# Patient Record
Sex: Female | Born: 1985 | Race: White | Hispanic: No | Marital: Single | Smoking: Current every day smoker
Health system: Southern US, Community
[De-identification: ages and names within clinical notes are randomized; demographics above are authoritative.]

---

## 2014-11-30 ENCOUNTER — Emergency Department (HOSPITAL_COMMUNITY): Payer: BLUE CROSS/BLUE SHIELD

## 2014-11-30 ENCOUNTER — Emergency Department (HOSPITAL_COMMUNITY)
Admission: EM | Admit: 2014-11-30 | Discharge: 2014-11-30 | Disposition: A | Discharge status: Home / Self Care | Payer: BLUE CROSS/BLUE SHIELD | Attending: Physician Assistant | Admitting: Physician Assistant

## 2014-11-30 ENCOUNTER — Encounter (HOSPITAL_COMMUNITY): Payer: Self-pay | Admitting: *Deleted

## 2014-11-30 DIAGNOSIS — R1031 Right lower quadrant pain: Secondary | ICD-10-CM

## 2014-11-30 DIAGNOSIS — R197 Diarrhea, unspecified: Secondary | ICD-10-CM | POA: Diagnosis not present

## 2014-11-30 DIAGNOSIS — Z3202 Encounter for pregnancy test, result negative: Secondary | ICD-10-CM | POA: Insufficient documentation

## 2014-11-30 DIAGNOSIS — Z720 Tobacco use: Secondary | ICD-10-CM | POA: Insufficient documentation

## 2014-11-30 DIAGNOSIS — R110 Nausea: Secondary | ICD-10-CM | POA: Diagnosis not present

## 2014-11-30 DIAGNOSIS — R5383 Other fatigue: Secondary | ICD-10-CM | POA: Insufficient documentation

## 2014-11-30 LAB — CBC WITH DIFFERENTIAL/PLATELET
BASOS ABS: 0 10*3/uL (ref 0.0–0.1)
BASOS PCT: 0 % (ref 0–1)
Eosinophils Absolute: 0 10*3/uL (ref 0.0–0.7)
Eosinophils Relative: 0 % (ref 0–5)
HEMATOCRIT: 38.8 % (ref 36.0–46.0)
HEMOGLOBIN: 12.9 g/dL (ref 12.0–15.0)
LYMPHS PCT: 9 % — AB (ref 12–46)
Lymphs Abs: 0.8 10*3/uL (ref 0.7–4.0)
MCH: 31.3 pg (ref 26.0–34.0)
MCHC: 33.2 g/dL (ref 30.0–36.0)
MCV: 94.2 fL (ref 78.0–100.0)
Monocytes Absolute: 0.7 10*3/uL (ref 0.1–1.0)
Monocytes Relative: 7 % (ref 3–12)
NEUTROS ABS: 8.2 10*3/uL — AB (ref 1.7–7.7)
NEUTROS PCT: 84 % — AB (ref 43–77)
Platelets: 234 10*3/uL (ref 150–400)
RBC: 4.12 MIL/uL (ref 3.87–5.11)
RDW: 12.5 % (ref 11.5–15.5)
WBC: 9.8 10*3/uL (ref 4.0–10.5)

## 2014-11-30 LAB — COMPREHENSIVE METABOLIC PANEL
ALBUMIN: 4.5 g/dL (ref 3.5–5.0)
ALK PHOS: 64 U/L (ref 38–126)
ALT: 13 U/L — ABNORMAL LOW (ref 14–54)
ANION GAP: 10 (ref 5–15)
AST: 27 U/L (ref 15–41)
BILIRUBIN TOTAL: 0.6 mg/dL (ref 0.3–1.2)
BUN: 11 mg/dL (ref 6–20)
CALCIUM: 9.5 mg/dL (ref 8.9–10.3)
CO2: 24 mmol/L (ref 22–32)
Chloride: 100 mmol/L — ABNORMAL LOW (ref 101–111)
Creatinine, Ser: 0.73 mg/dL (ref 0.44–1.00)
GLUCOSE: 104 mg/dL — AB (ref 65–99)
POTASSIUM: 3.5 mmol/L (ref 3.5–5.1)
Sodium: 134 mmol/L — ABNORMAL LOW (ref 135–145)
TOTAL PROTEIN: 8.1 g/dL (ref 6.5–8.1)

## 2014-11-30 LAB — I-STAT BETA HCG BLOOD, ED (MC, WL, AP ONLY)

## 2014-11-30 LAB — PREGNANCY, URINE: Preg Test, Ur: NEGATIVE

## 2014-11-30 LAB — LIPASE, BLOOD: Lipase: 14 U/L — ABNORMAL LOW (ref 22–51)

## 2014-11-30 MED ORDER — SODIUM CHLORIDE 0.9 % IV BOLUS (SEPSIS)
1000.0000 mL | Freq: Once | INTRAVENOUS | Status: AC
  Administered 2014-11-30: 1000 mL via INTRAVENOUS

## 2014-11-30 MED ORDER — IOHEXOL 300 MG/ML  SOLN
50.0000 mL | Freq: Once | INTRAMUSCULAR | Status: AC | PRN
  Administered 2014-11-30: 50 mL via ORAL

## 2014-11-30 MED ORDER — MORPHINE SULFATE (PF) 4 MG/ML IV SOLN
4.0000 mg | Freq: Once | INTRAVENOUS | Status: AC
  Administered 2014-11-30: 4 mg via INTRAVENOUS
  Filled 2014-11-30: qty 1

## 2014-11-30 MED ORDER — ONDANSETRON HCL 4 MG/2ML IJ SOLN
4.0000 mg | Freq: Once | INTRAMUSCULAR | Status: AC
  Administered 2014-11-30: 4 mg via INTRAVENOUS
  Filled 2014-11-30: qty 2

## 2014-11-30 MED ORDER — IOHEXOL 300 MG/ML  SOLN
100.0000 mL | Freq: Once | INTRAMUSCULAR | Status: AC | PRN
  Administered 2014-11-30: 100 mL via INTRAVENOUS

## 2014-11-30 NOTE — Discharge Instructions (Signed)
We are unsure what caused your pain today. Please follow-up with your doctors at your college in the morning. Please return immediately if you have any additional symptoms.  Abdominal Pain, Women Abdominal (stomach, pelvic, or belly) pain can be caused by many things. It is important to tell your doctor:  The location of the pain.  Does it come and go or is it present all the time?  Are there things that start the pain (eating certain foods, exercise)?  Are there other symptoms associated with the pain (fever, nausea, vomiting, diarrhea)? All of this is helpful to know when trying to find the cause of the pain. CAUSES   Stomach: virus or bacteria infection, or ulcer.  Intestine: appendicitis (inflamed appendix), regional ileitis (Crohn's disease), ulcerative colitis (inflamed colon), irritable bowel syndrome, diverticulitis (inflamed diverticulum of the colon), or cancer of the stomach or intestine.  Gallbladder disease or stones in the gallbladder.  Kidney disease, kidney stones, or infection.  Pancreas infection or cancer.  Fibromyalgia (pain disorder).  Diseases of the female organs:  Uterus: fibroid (non-cancerous) tumors or infection.  Fallopian tubes: infection or tubal pregnancy.  Ovary: cysts or tumors.  Pelvic adhesions (scar tissue).  Endometriosis (uterus lining tissue growing in the pelvis and on the pelvic organs).  Pelvic congestion syndrome (female organs filling up with blood just before the menstrual period).  Pain with the menstrual period.  Pain with ovulation (producing an egg).  Pain with an IUD (intrauterine device, birth control) in the uterus.  Cancer of the female organs.  Functional pain (pain not caused by a disease, may improve without treatment).  Psychological pain.  Depression. DIAGNOSIS  Your doctor will decide the seriousness of your pain by doing an examination.  Blood tests.  X-rays.  Ultrasound.  CT scan (computed  tomography, special type of X-ray).  MRI (magnetic resonance imaging).  Cultures, for infection.  Barium enema (dye inserted in the large intestine, to better view it with X-rays).  Colonoscopy (looking in intestine with a lighted tube).  Laparoscopy (minor surgery, looking in abdomen with a lighted tube).  Major abdominal exploratory surgery (looking in abdomen with a large incision). TREATMENT  The treatment will depend on the cause of the pain.   Many cases can be observed and treated at home.  Over-the-counter medicines recommended by your caregiver.  Prescription medicine.  Antibiotics, for infection.  Birth control pills, for painful periods or for ovulation pain.  Hormone treatment, for endometriosis.  Nerve blocking injections.  Physical therapy.  Antidepressants.  Counseling with a psychologist or psychiatrist.  Minor or major surgery. HOME CARE INSTRUCTIONS   Do not take laxatives, unless directed by your caregiver.  Take over-the-counter pain medicine only if ordered by your caregiver. Do not take aspirin because it can cause an upset stomach or bleeding.  Try a clear liquid diet (broth or water) as ordered by your caregiver. Slowly move to a bland diet, as tolerated, if the pain is related to the stomach or intestine.  Have a thermometer and take your temperature several times a day, and record it.  Bed rest and sleep, if it helps the pain.  Avoid sexual intercourse, if it causes pain.  Avoid stressful situations.  Keep your follow-up appointments and tests, as your caregiver orders.  If the pain does not go away with medicine or surgery, you may try:  Acupuncture.  Relaxation exercises (yoga, meditation).  Group therapy.  Counseling. SEEK MEDICAL CARE IF:   You notice certain foods cause  stomach pain.  Your home care treatment is not helping your pain.  You need stronger pain medicine.  You want your IUD removed.  You feel faint  or lightheaded.  You develop nausea and vomiting.  You develop a rash.  You are having side effects or an allergy to your medicine. SEEK IMMEDIATE MEDICAL CARE IF:   Your pain does not go away or gets worse.  You have a fever.  Your pain is felt only in portions of the abdomen. The right side could possibly be appendicitis. The left lower portion of the abdomen could be colitis or diverticulitis.  You are passing blood in your stools (bright red or black tarry stools, with or without vomiting).  You have blood in your urine.  You develop chills, with or without a fever.  You pass out. MAKE SURE YOU:   Understand these instructions.  Will watch your condition.  Will get help right away if you are not doing well or get worse. Document Released: 01/22/2007 Document Revised: 08/11/2013 Document Reviewed: 02/11/2009 [facility] Patient Information 2015 [place], [place]. This information is not intended to replace advice given to you by your health care provider. Make sure you discuss any questions you have with your health care provider.

## 2014-11-30 NOTE — ED Notes (Signed)
Pt reports abd pain since Saturday, pain has increased, [facility], paperwork states " fever>102 with RLQ tenderness. UA negative. CBC with WBC #10.5 with left shift".   Pain 8/10. Reports nausea, denies vomiting.

## 2014-11-30 NOTE — ED Provider Notes (Signed)
CSN: [id number]     Arrival date & time 11/30/14  1726 History   First MD Initiated Contact with Patient 11/30/14 1748     Chief Complaint  Patient presents with  . Abdominal Pain, r/o Appendicitis      (Consider location/radiation/quality/duration/timing/severity/associated sxs/prior Treatment) HPI   Patient is a 29 year old female presenting with right lower quadrant pain started this morning. She had diarrhea yesterday then today started with right lower quadrant pain and nausea. Patient seen [facility] and sent here for rule out appendicitis. Patient's had no vaginal discharge or other symptoms.  History reviewed. No pertinent past medical history. History reviewed. No pertinent past surgical history. History reviewed. No pertinent family history. Social History  Substance Use Topics  . Smoking status: Current Every Day Smoker -- 0.10 packs/day for 10 years    Types: Cigarettes  . Smokeless tobacco: None  . Alcohol Use: Yes   OB History    No data available     Review of Systems  Constitutional: Positive for fever and fatigue. Negative for activity change.  HENT: Negative for congestion and drooling.   Eyes: Negative for discharge.  Respiratory: Negative for cough and chest tightness.   Cardiovascular: Negative for chest pain.  Gastrointestinal: Positive for nausea, abdominal pain and diarrhea. Negative for vomiting and abdominal distention.  Genitourinary: Negative for dysuria and difficulty urinating.  Musculoskeletal: Negative for joint swelling.  Skin: Negative for rash.  Allergic/Immunologic: Negative for immunocompromised state.  Neurological: Negative for seizures and speech difficulty.  Psychiatric/Behavioral: Negative for behavioral problems and agitation.      Allergies  Review of patient's allergies indicates no known allergies.  Home Medications   Prior to Admission medications   Not on File   BP 116/66 mmHg  Pulse 116  Temp(Src) 101.6  F (38.7 C) (Oral)  Resp 16  SpO2 99%  LMP 11/18/2014 (Approximate) Physical Exam  Constitutional: She is oriented to person, place, and time. She appears well-developed and well-nourished.  HENT:  Head: Normocephalic and atraumatic.  Eyes: Conjunctivae are normal. Right eye exhibits no discharge.  Neck: Neck supple.  Cardiovascular: Normal rate, regular rhythm and normal heart sounds.   No murmur heard. Pulmonary/Chest: Effort normal and breath sounds normal. She has no wheezes. She has no rales.  Abdominal: Soft. She exhibits no distension. There is tenderness.  Right lower quadrant tenderness.  Musculoskeletal: Normal range of motion. She exhibits no edema.  Neurological: She is oriented to person, place, and time. No cranial nerve deficit.  Skin: Skin is warm and dry. No rash noted. She is not diaphoretic.  Psychiatric: She has a normal mood and affect. Her behavior is normal.  Nursing note and vitals reviewed.   ED Course  Procedures (including critical care time) Labs Review Labs Reviewed  LIPASE, BLOOD  COMPREHENSIVE METABOLIC PANEL  CBC WITH DIFFERENTIAL/PLATELET  PREGNANCY, URINE  I-STAT BETA HCG BLOOD, ED ([facility], [facility], [facility] ONLY)    Imaging Review No results found. I have personally reviewed and evaluated these images and lab results as part of my medical decision-making.   EKG Interpretation None      MDM   Final diagnoses:  None   patient is otherwise healthy 29 year old  female presenting with right lower quadrant pain and fever. Highly suspect appendicitis at this time. We'll get point-of-care pregnancy test and then CT scan of the abdomen. We'll give fluids morphine and Zofran to help with symptoms.  If CT negative we'll consider doing vaginal exam and then  [place].  CT negative, [place] negative.  Pain resolved. No CMT tenderness, pt is lesbian, no female partners, recent neg STD testing 6 mo ago.   Will have patietn follow up with student [facility] in the am. She is  taking PO.   Ezzard Ditmer Randall An, MD 11/30/14 2245

## 2014-11-30 NOTE — ED Notes (Signed)
Pt has sprite at the bedside.  Pt sts she is able to hold down fluids without problem.  RN notified.

## 2014-12-01 LAB — HIV ANTIBODY (ROUTINE TESTING W REFLEX): HIV Screen 4th Generation wRfx: NONREACTIVE

## 2014-12-01 LAB — RPR: RPR: NONREACTIVE

## 2016-11-04 IMAGING — US US PELVIS COMPLETE
1 series · 14 of 25 positions shown · non-contrast
Comparison: None

CLINICAL DATA: Right lower quadrant pain

EXAM:
TRANSABDOMINAL AND TRANSVAGINAL ULTRASOUND OF PELVIS
TECHNIQUE: Both transabdominal and transvaginal ultrasound examinations of the
pelvis were performed. Transabdominal technique was performed for
global imaging of the pelvis including uterus, ovaries, adnexal
regions, and pelvic cul-de-sac. It was necessary to proceed with
endovaginal exam following the transabdominal exam to visualize the
right ovary.

[Series 1: us pelvis complete · 0.20mm/px · 14 of 40 slices shown]
[im 1/40]
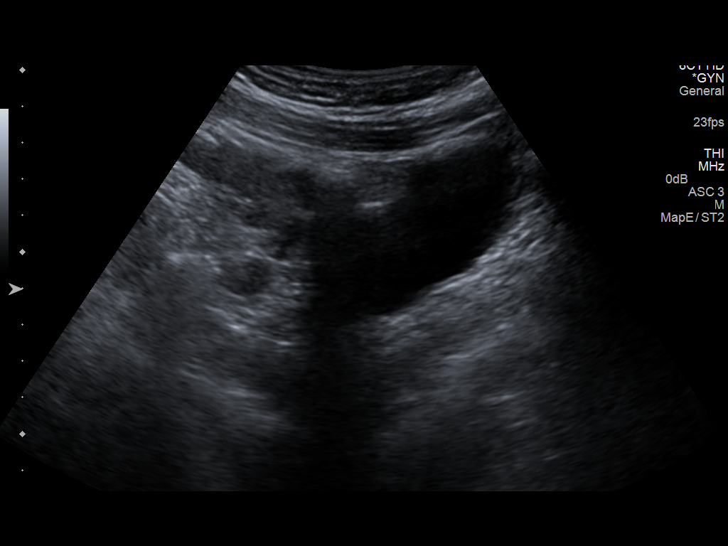
[im 4/40]
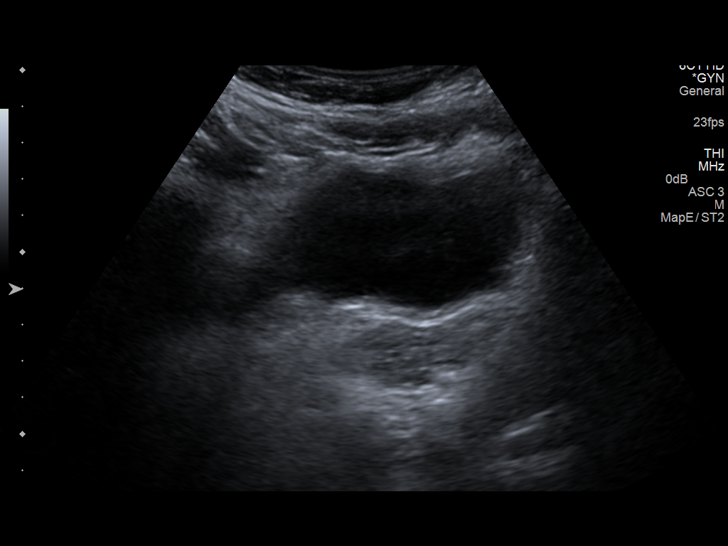
[im 7/40]
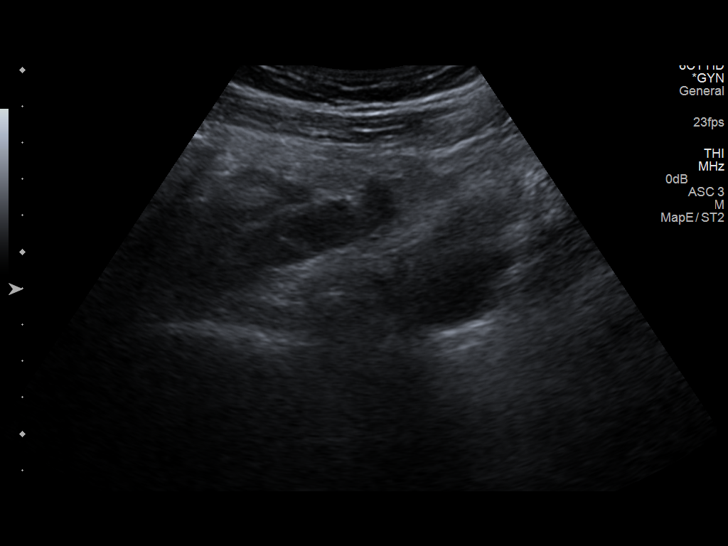
[im 10/40]
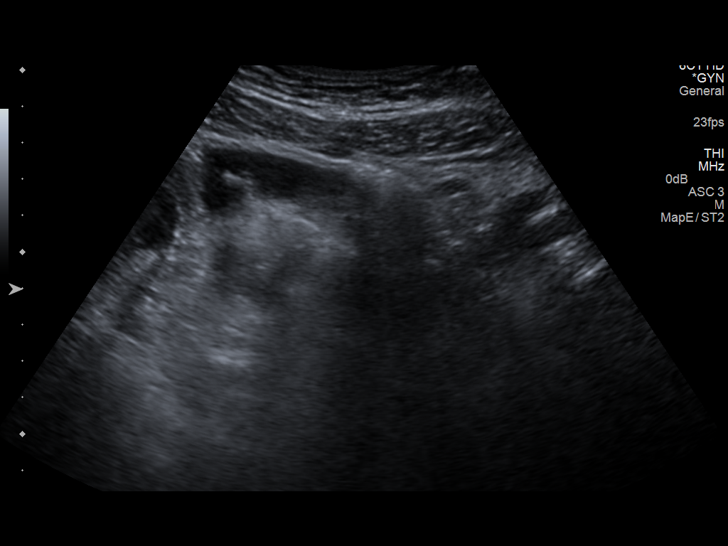
[im 14/40]
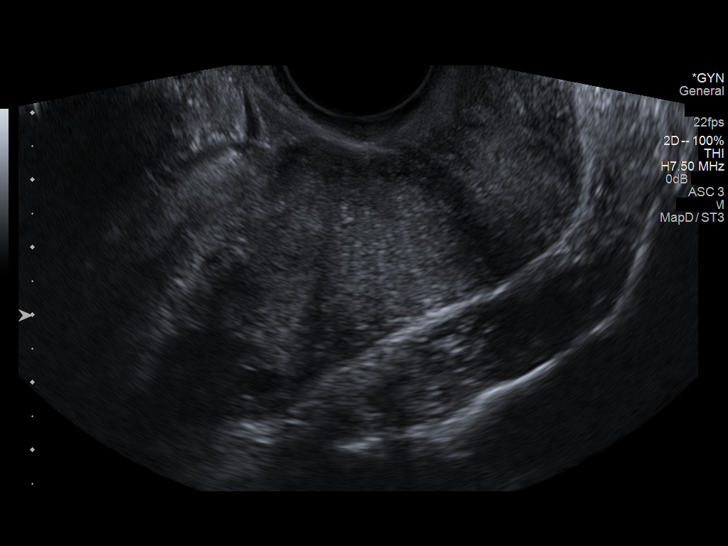
[im 15/40]
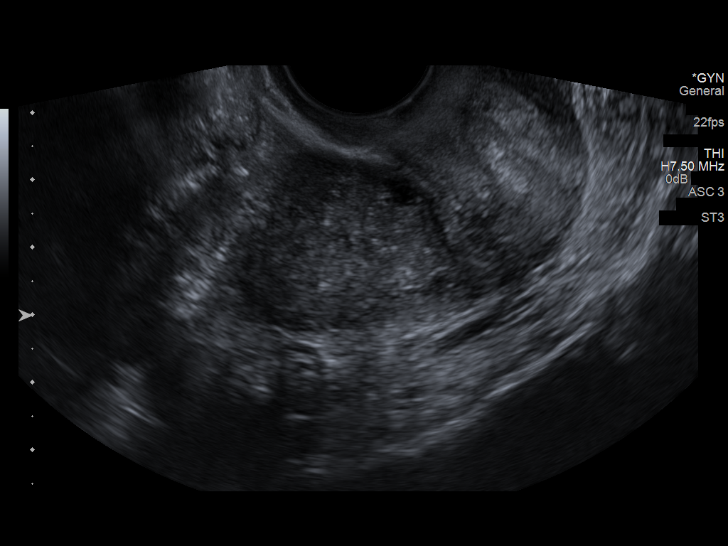
[im 18/40]
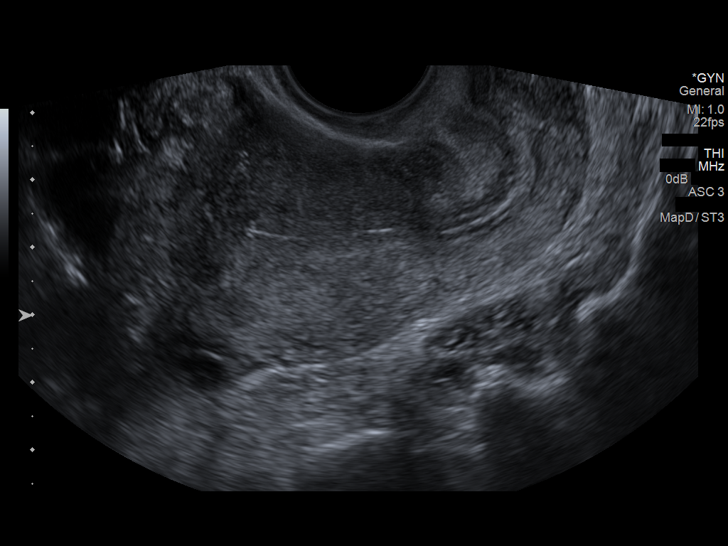
[im 22/40]
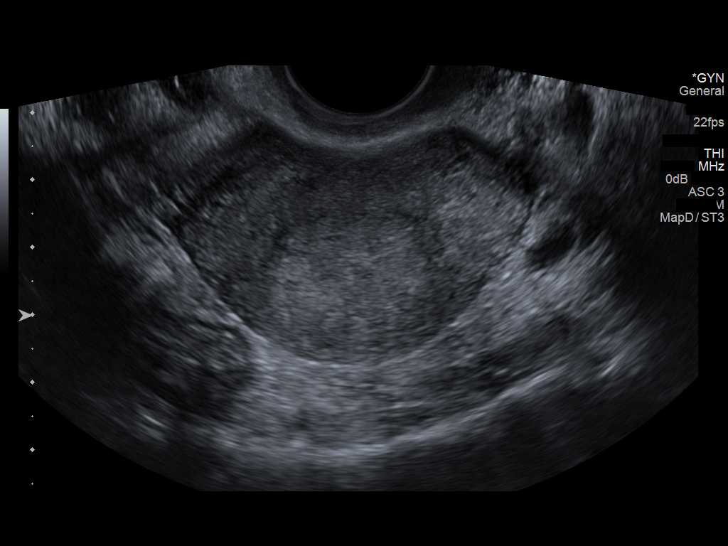
[im 25/40]
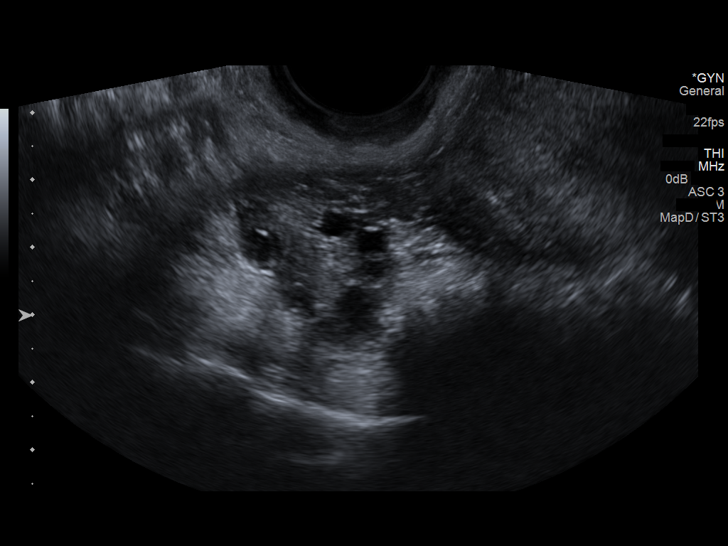
[im 27/40]
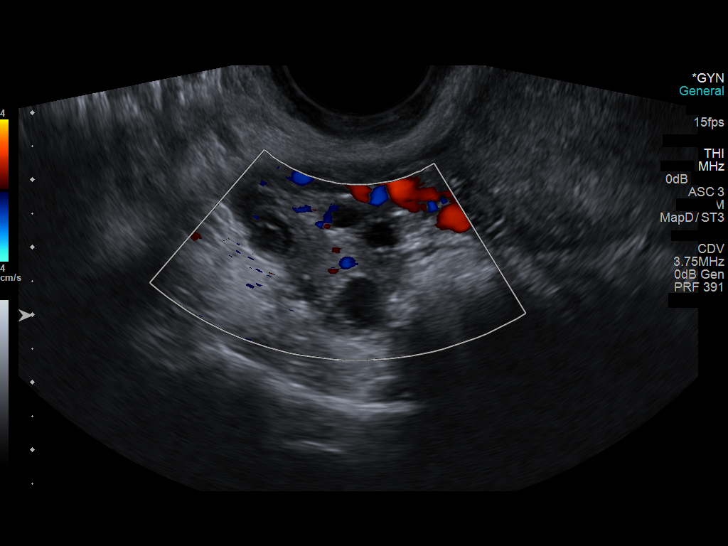
[im 30/40]
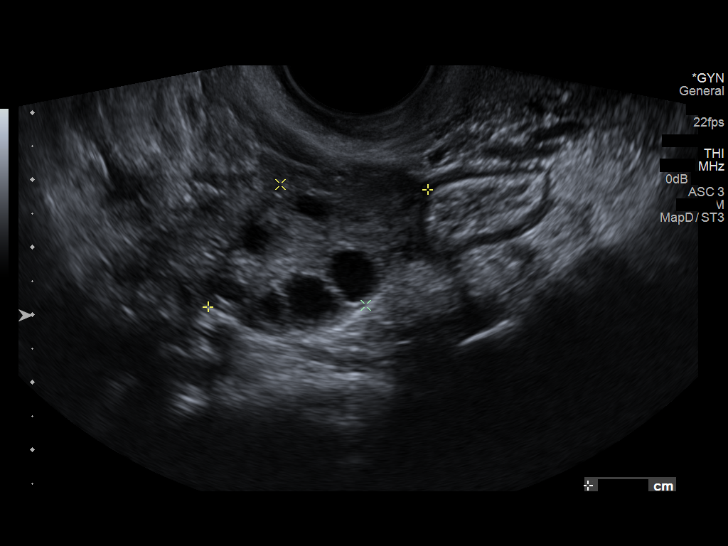
[im 33/40]
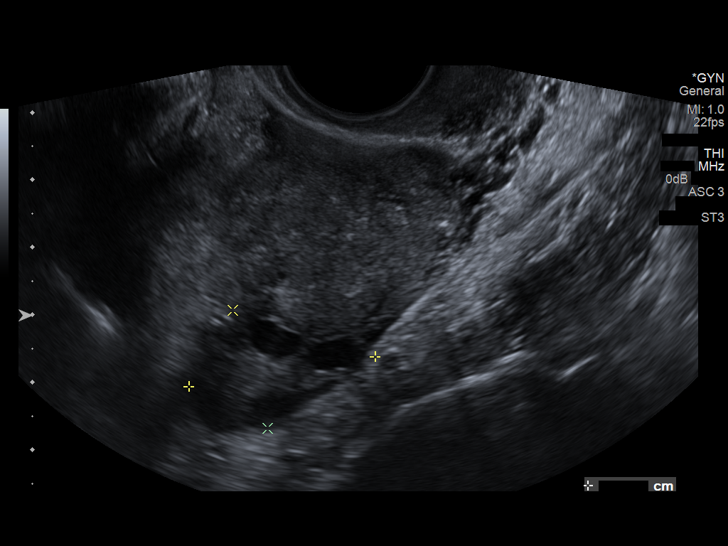
[im 36/40]
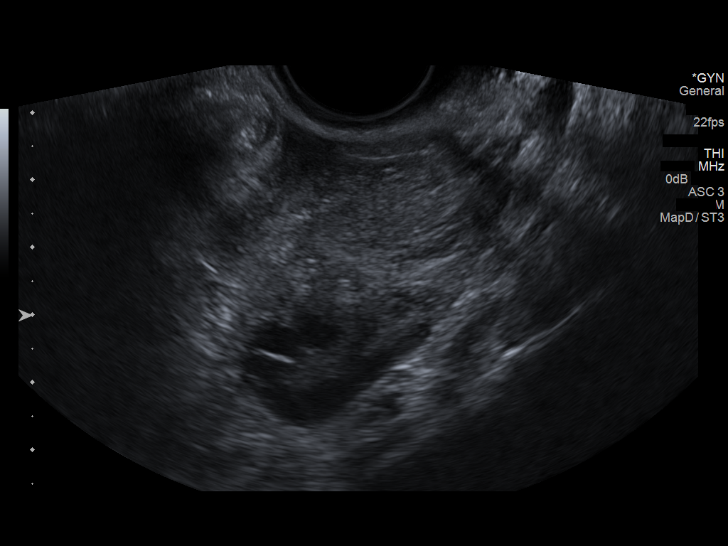
[im 40/40]
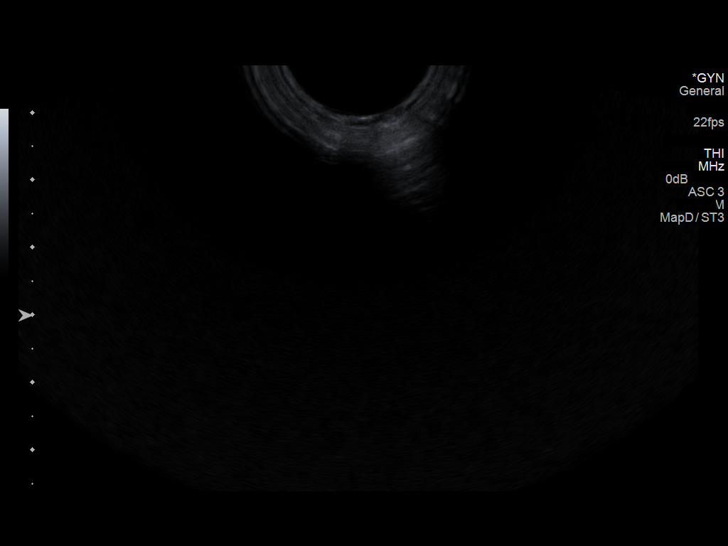

[14 of 25 positions shown; findings below may reference images not displayed]

FINDINGS: Uterus

Measurements: 5.5 x 3.7 x 5.1 cm. No fibroids or other mass
visualized.

Endometrium

Thickness: 7 mm..  No focal abnormality visualized.

Right ovary

Measurements: 3.7 x 2.2 x 2.0 cm.. Normal appearance/no adnexal
mass.

Left ovary

Measurements: 2.8 x 1.8 x 3.1 cm. Normal appearance/no adnexal mass.

Other findings

Trace free fluid noted.
IMPRESSION: 1. No acute findings.  No explanation for right lower quadrant pain.
2. Trace amount of free fluid noted within the pelvis.

## 2017-02-08 IMAGING — CT CT ABD-PELV W/ CM
2 of 4 series · 17 of 46 positions shown, 19 images · IV contrast (OMNIPAQUE 300)
Comparison: None.

CLINICAL DATA: Acute right lower quadrant abdominal pain.

EXAM:
CT ABDOMEN AND PELVIS WITH CONTRAST
TECHNIQUE: Multidetector CT imaging of the abdomen and pelvis was performed
using the standard protocol following bolus administration of
intravenous contrast.
CONTRAST:  100mL OMNIPAQUE IOHEXOL 300 MG/ML SOLN, 50mL OMNIPAQUE
IOHEXOL 300 MG/ML SOLN

[Series 2: abd/pel with · axial · 0.74mm/px · z∈[-655,-275]mm · 14 of 84 slices shown, 16 images]
[im 4/84  soft-tissue]
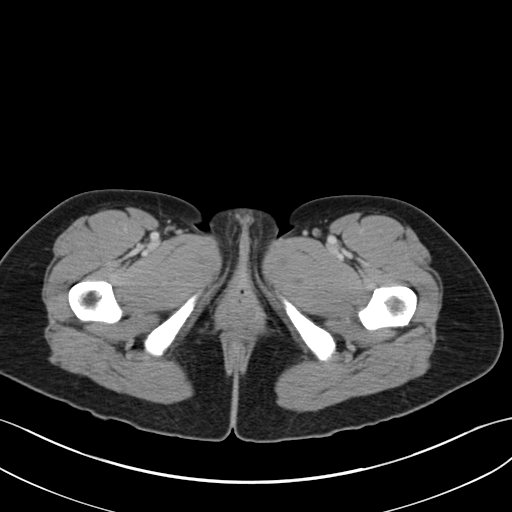
[im 4/84  bone]
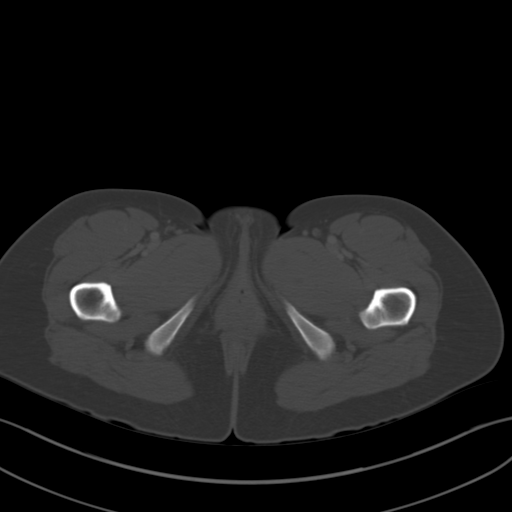
[im 10/84  soft-tissue]
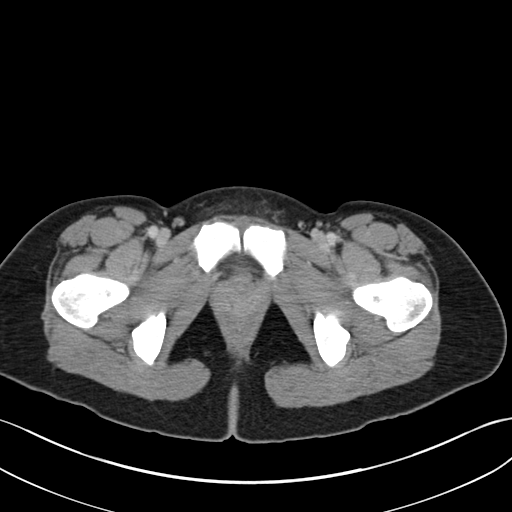
[im 17/84  soft-tissue]
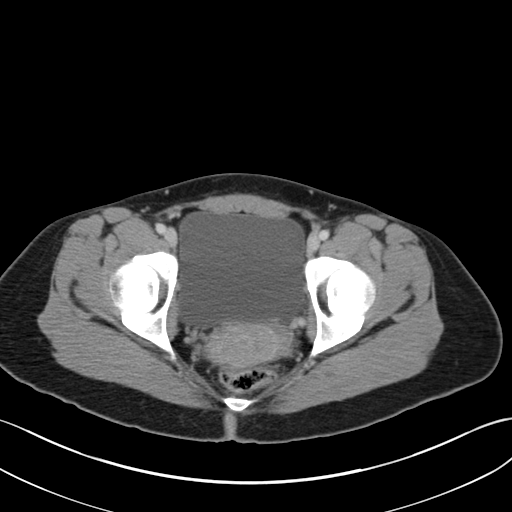
[im 24/84  soft-tissue]
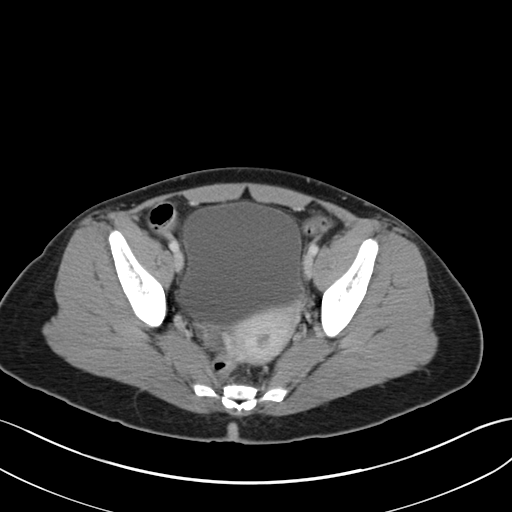
[im 27/84  soft-tissue]
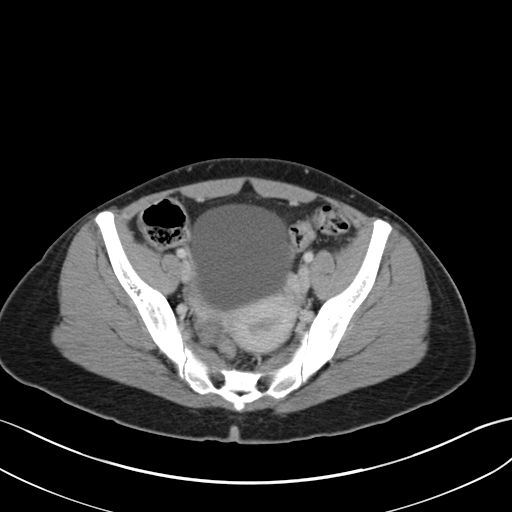
[im 34/84  soft-tissue]
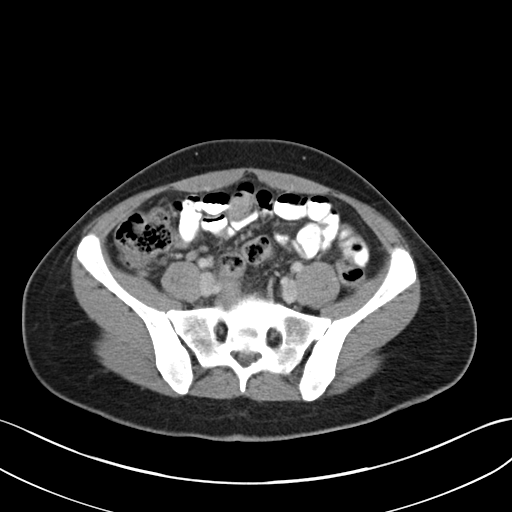
[im 40/84  soft-tissue]
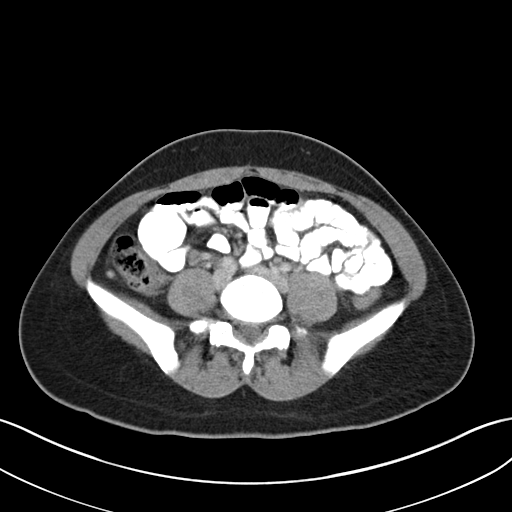
[im 44/84  soft-tissue]
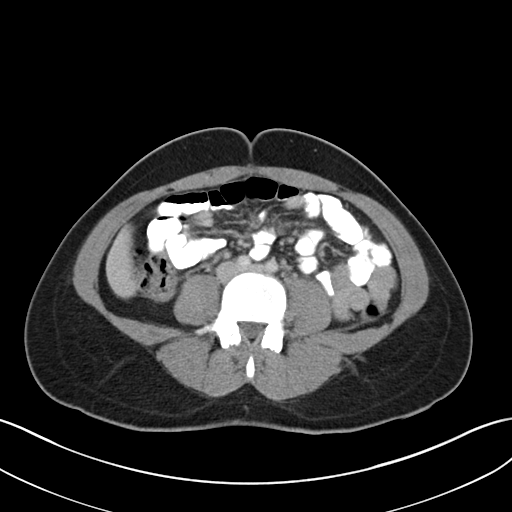
[im 50/84  soft-tissue]
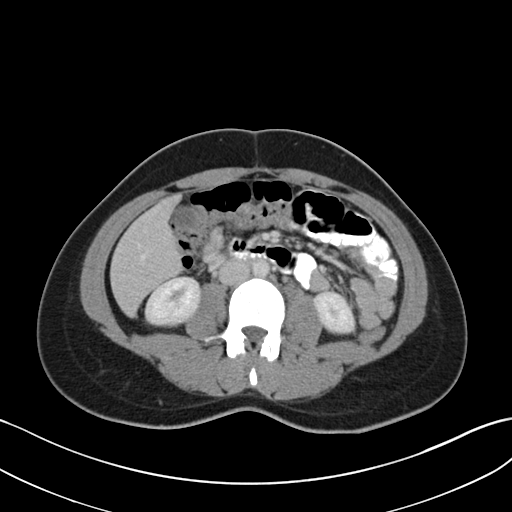
[im 50/84  bone]
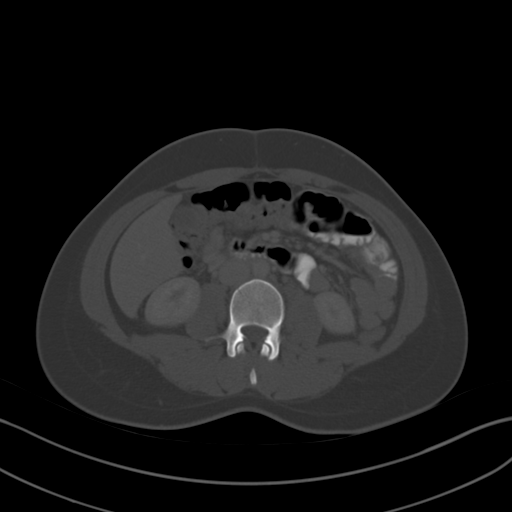
[im 57/84  soft-tissue]
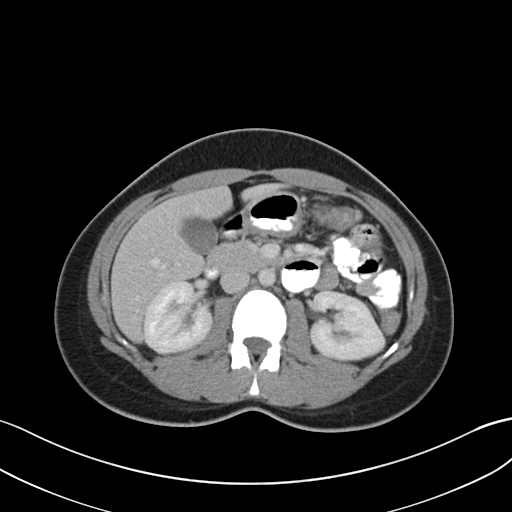
[im 64/84  soft-tissue]
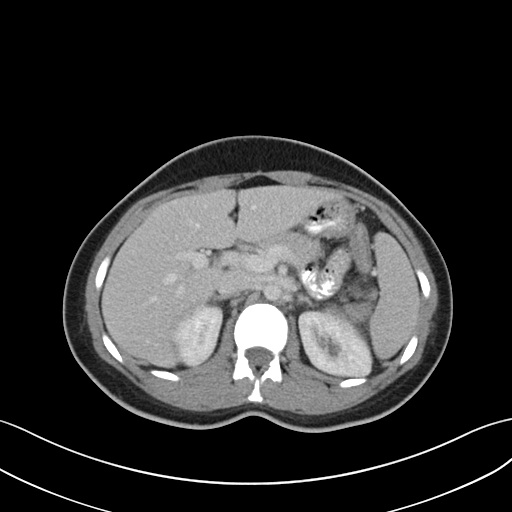
[im 67/84  soft-tissue]
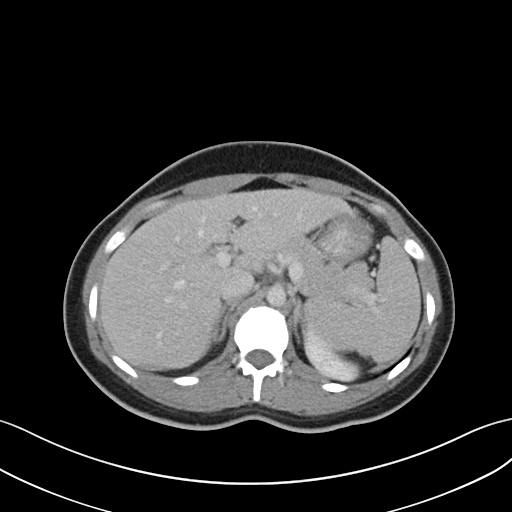
[im 74/84  soft-tissue]
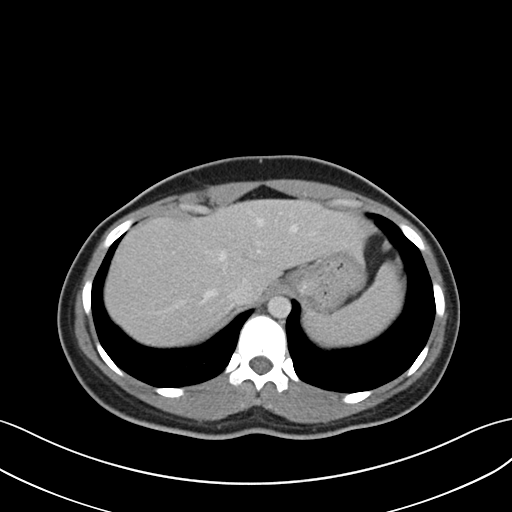
[im 80/84  soft-tissue]
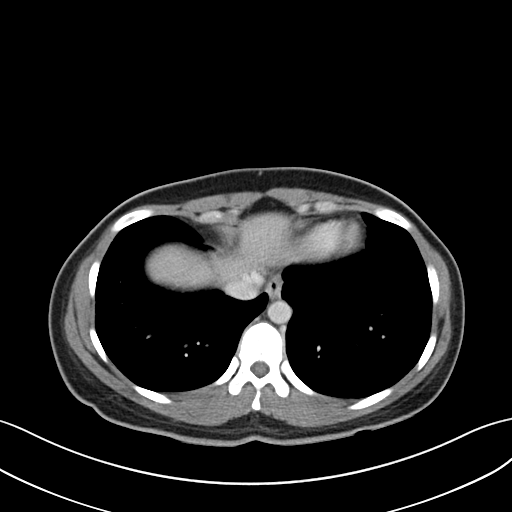

[Series 3: coronal a/|p · coronal · 0.76mm/px · 3 of 83 slices shown]
[im 28/83  soft-tissue]
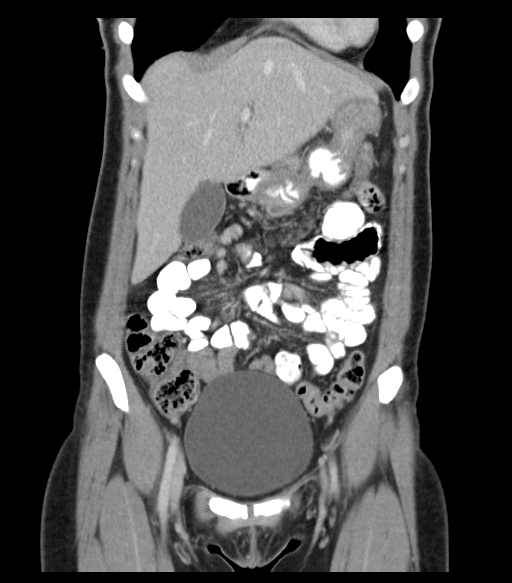
[im 37/83  soft-tissue]
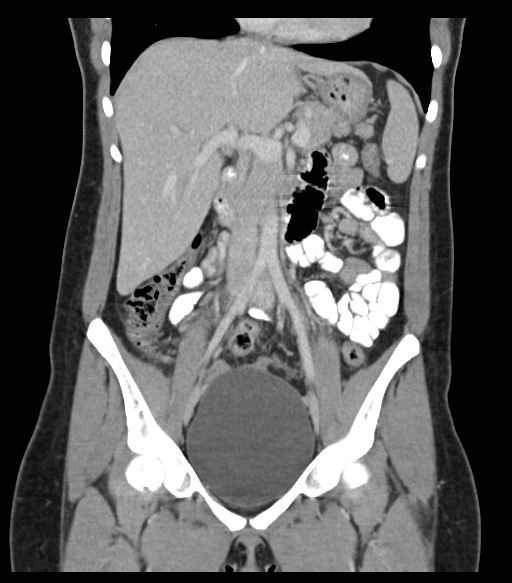
[im 46/83  soft-tissue]
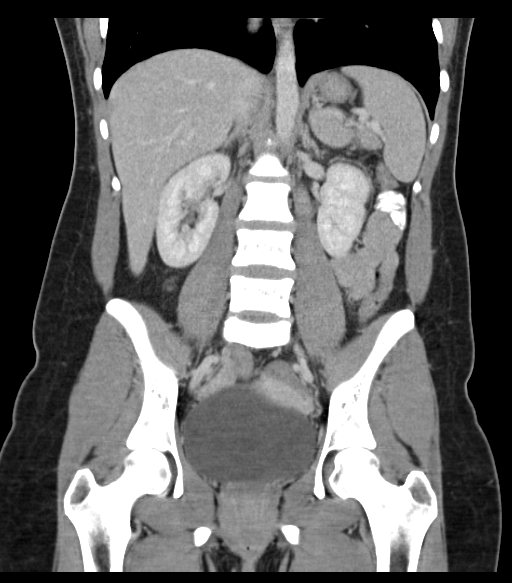

[17 of 46 positions shown; findings below may reference images not displayed]

FINDINGS: Visualized lung bases appear normal. No significant osseous
abnormality is noted.

No gallstones are noted. The liver, spleen and pancreas appear
normal. Adrenal glands and kidneys appear normal. No hydronephrosis
or renal obstruction is noted. The appendix appears normal. There is
no evidence of bowel obstruction. Urinary bladder appears normal.
Uterus and ovaries appear normal. No significant adenopathy is
noted.
IMPRESSION: No acute abnormality seen in the abdomen or pelvis.
